# Patient Record
Sex: Male | Born: 1975
Health system: Southern US, Community
[De-identification: ages and names within clinical notes are randomized; demographics above are authoritative.]

## PROBLEM LIST (undated history)

## (undated) DIAGNOSIS — Z113 Encounter for screening for infections with a predominantly sexual mode of transmission: Secondary | ICD-10-CM

## (undated) DIAGNOSIS — I319 Disease of pericardium, unspecified: Secondary | ICD-10-CM

## (undated) DIAGNOSIS — N451 Epididymitis: Secondary | ICD-10-CM

## (undated) DIAGNOSIS — F419 Anxiety disorder, unspecified: Secondary | ICD-10-CM

## (undated) DIAGNOSIS — B690 Cysticercosis of central nervous system: Secondary | ICD-10-CM

## (undated) DIAGNOSIS — R197 Diarrhea, unspecified: Secondary | ICD-10-CM

## (undated) DIAGNOSIS — Z87438 Personal history of other diseases of male genital organs: Secondary | ICD-10-CM

## (undated) HISTORY — DX: Diarrhea, unspecified: R19.7

## (undated) HISTORY — DX: Epididymitis: N45.1

## (undated) HISTORY — DX: Anxiety disorder, unspecified: F41.9

## (undated) HISTORY — DX: Encounter for screening for infections with a predominantly sexual mode of transmission: Z11.3

## (undated) HISTORY — DX: Cysticercosis of central nervous system: B69.0

## (undated) HISTORY — DX: Disease of pericardium, unspecified: I31.9

## (undated) HISTORY — DX: Personal history of other diseases of male genital organs: Z87.438

---

## 2004-07-13 ENCOUNTER — Emergency Department (HOSPITAL_COMMUNITY): Admission: EM | Admit: 2004-07-13 | Discharge: 2004-07-13 | Payer: Self-pay | Admitting: Emergency Medicine

## 2007-07-07 ENCOUNTER — Emergency Department (HOSPITAL_COMMUNITY): Admission: EM | Admit: 2007-07-07 | Discharge: 2007-07-07 | Payer: Self-pay | Admitting: Family Medicine

## 2011-12-13 ENCOUNTER — Ambulatory Visit: Payer: Self-pay | Admitting: Family Medicine

## 2014-10-24 ENCOUNTER — Encounter (HOSPITAL_COMMUNITY): Payer: Self-pay

## 2014-10-24 ENCOUNTER — Emergency Department (INDEPENDENT_AMBULATORY_CARE_PROVIDER_SITE_OTHER)
Admission: EM | Admit: 2014-10-24 | Discharge: 2014-10-24 | Disposition: A | Discharge status: Home / Self Care | Payer: BLUE CROSS/BLUE SHIELD | Source: Home / Self Care | Attending: Emergency Medicine | Admitting: Emergency Medicine

## 2014-10-24 DIAGNOSIS — J111 Influenza due to unidentified influenza virus with other respiratory manifestations: Secondary | ICD-10-CM | POA: Diagnosis not present

## 2014-10-24 NOTE — ED Notes (Signed)
C/o sick w cough , fever , congestion, body aches, fatigue. Exposed to co-worker w flu

## 2014-10-24 NOTE — ED Provider Notes (Signed)
CSN: [id number]     Arrival date & time 10/24/14  1259 History   First MD Initiated Contact with Patient 10/24/14 1328     Chief Complaint  Patient presents with  . Cough  . Fever   (Consider location/radiation/quality/duration/timing/severity/associated sxs/prior Treatment) HPI He is a 39 year old man here for evaluation of fever and cough. His son is also here for the same symptoms. He states this started on Sunday with fever, body aches, chills, cough. He states the symptoms were bad Sunday through Tuesday. Since then he has gradually been improving. Today he complains of a mild lingering cough and some hoarseness of voice. No further fevers. No nausea or vomiting. He has been taking Mucinex with some improvement.  History reviewed. No pertinent past medical history. History reviewed. No pertinent past surgical history. History reviewed. No pertinent family history. History  Substance Use Topics  . Smoking status: Current Every Day Smoker  . Smokeless tobacco: Not on file  . Alcohol Use: Yes    Review of Systems  As in history of present illness  Allergies  Review of patient's allergies indicates no known allergies.  Home Medications   Prior to Admission medications   Not on File   BP 122/81 mmHg  Pulse 79  Temp(Src) 98.5 F (36.9 C) (Oral)  Resp 16  SpO2 95% Physical Exam  Constitutional: He is oriented to person, place, and time. He appears well-developed and well-nourished. No distress.  HENT:  Nose: Nose normal.  Mouth/Throat: Oropharynx is clear and moist. No oropharyngeal exudate.  Eyes: Conjunctivae are normal.  Neck: Neck supple.  Cardiovascular: Normal rate, regular rhythm and normal heart sounds.   No murmur heard. Pulmonary/Chest: Effort normal and breath sounds normal. No respiratory distress. He has no wheezes. He has no rales.  Lymphadenopathy:    He has no cervical adenopathy.  Neurological: He is alert and oriented to person, place, and time.     ED Course  Procedures (including critical care time) Labs Review Labs Reviewed - No data to display  Imaging Review No results found.   MDM   1. Influenza    He is recovering from the flu. He is well appearing. No concern for bacterial superinfection. Continue symptomatic care with Mucinex as needed.   Charm RingsErin J Royalti Schauf, MD 10/24/14 1420

## 2014-10-24 NOTE — Discharge Instructions (Signed)
You are recovering from the flu. You can continue to use over-the-counter Mucinex as needed. You should return to normal over the next few days.

## 2017-05-31 ENCOUNTER — Telehealth: Payer: Self-pay

## 2017-05-31 ENCOUNTER — Encounter: Payer: Self-pay | Admitting: Neurology

## 2017-05-31 ENCOUNTER — Encounter (INDEPENDENT_AMBULATORY_CARE_PROVIDER_SITE_OTHER): Payer: Self-pay

## 2017-05-31 ENCOUNTER — Ambulatory Visit: Payer: BLUE CROSS/BLUE SHIELD | Admitting: Neurology

## 2017-05-31 ENCOUNTER — Other Ambulatory Visit: Payer: Self-pay | Admitting: Neurology

## 2017-05-31 VITALS — BP 119/77 | HR 75 | Ht 70.0 in | Wt 170.4 lb

## 2017-05-31 DIAGNOSIS — G939 Disorder of brain, unspecified: Secondary | ICD-10-CM | POA: Insufficient documentation

## 2017-05-31 NOTE — Telephone Encounter (Signed)
Rn call [facility] at [phone] and [phone] [place] locations. Rn spoke with Alcario DroughtErica that no disc was sent with the referral. Alcario Droughtrica stated the pt was seen for the first time on 05/26/2017 at their office in the urgent care department. Pt does not have any office visits there bedside the urgent care visit. He has not seen any NP PA MD for regular check ups. Rn ask if the pt took any medications, or had any significant medical history. They stated no history is on pt bedside the left facial dropping. Rn stated pt is schedule for today a 230 to see neurologist for first time. Rn requested the disc be sent to [facility] Marvel [facility]indong [facility] as[facility]. Address given Alcario Droughtrica stated she will take care of it, and get disc sent out.

## 2017-05-31 NOTE — Patient Instructions (Addendum)
-   will do MRI with and without contrast  - will arrange for spinal tab - will do EEG  - will refer to neurosurgery for consultation - close observation and let us know if there any new issues during the interval time.  - will do blood culture today.  - follow up in one month

## 2017-05-31 NOTE — Progress Notes (Signed)
NEUROLOGY CLINIC NEW PATIENT NOTE  NAME: Cory Hernandez DOB: [birth year] REFERRING PHYSICIAN: Doreen Salvage, PA-C  I saw Cory Hernandez as a new consult in the neurovascular clinic today regarding  Chief Complaint  Patient presents with  . New Patient (Initial Visit)    Left side of face droop, has been having for a month went to [facility]  for issue and had a Ct scan at the place,  .  HPI: Cory Hernandez is a 41 y.o. male with no PMH who presents as a new patient for left facial droop.   Patient stated that for the last 3 weeks, he found himself slowing down his speech, intermittently stumbling on his words, feeling like talking with swollen tongue, also has drooling on the left side corner of the mouth.  3-4 days ago, he woke up with right facial twitching, intermittent, lasting all day long, eventually went away but her speech become more altered.  He looked at the mirror and found he had left facial droop.  He denies any headache, numbness, weakness, loss of consciousness, or fever.  Also no recent illness.  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  He was referred here for further evaluation.  He denied any significant past medical history.  Denies smoking or heavy drinking.  Denies any illicit drug use.  However he admitted that he used Viper, but regular content, no illegal stuff.   Past Medical History:  Diagnosis Date  . Pericarditis    History reviewed. No pertinent surgical history. Family History  Problem Relation Age of Onset  . Stroke Maternal Grandmother   . Stroke Paternal Grandfather    No current outpatient medications on file.   No current facility-administered medications for this visit.    No Known Allergies Social History   Socioeconomic History  . Marital status: Single    Spouse name: Not on file  . Number of children: Not on file  . Years of education: Not on file  .  Highest education level: Not on file  Social Needs  . Financial resource strain: Not on file  . Food insecurity - worry: Not on file  . Food insecurity - inability: Not on file  . Transportation needs - medical: Not on file  . Transportation needs - non-medical: Not on file  Occupational History  . Not on file  Tobacco Use  . Smoking status: Current Every Day Smoker    Packs/day: 0.25  . Smokeless tobacco: Never Used  . Tobacco comment: 1 to 2 per day  Substance and Sexual Activity  . Alcohol use: Yes    Alcohol/week: 0.6 oz    Types: 1 Cans of beer per week    Comment: socially  . Drug use: No  . Sexual activity: Not on file  Other Topics Concern  . Not on file  Social History Narrative  . Not on file    Review of Systems Full 14 system review of systems performed and notable only for those listed, all others are neg:  Constitutional:   Cardiovascular:  Ear/Nose/Throat:   Skin:  Eyes:   Respiratory:   Gastroitestinal:   Genitourinary:  Hematology/Lymphatic:   Endocrine:  Musculoskeletal:   Allergy/Immunology:   Neurological: Slurred speech Psychiatric:  Sleep:    Physical Exam  Vitals:   05/31/17 1501  BP: 119/77  Pulse: 75    General - Well nourished, well developed, in no apparent distress.  Ophthalmologic - Sharp disc margins OU.  Cardiovascular - Regular rate and rhythm with no murmur.   Neck - supple, no nuchal rigidity.  Mental Status -  Level of arousal and orientation to time, place, and person were intact. Language including expression, naming, repetition, comprehension, reading, and writing was assessed and found intact. Attention span and concentration were normal. Recent and remote memory were intact. Fund of Knowledge was assessed and was intact.  Cranial Nerves II - XII - II - Visual field intact OU. III, IV, VI - Extraocular movements intact. V - Facial sensation intact bilaterally. VII - left nasolabial fold flattening, and  mild left facial droop. VIII - Hearing & vestibular intact bilaterally. X - Palate elevates symmetrically, slight dysarthria. XI - Chin turning & shoulder shrug intact bilaterally. XII - Tongue protrusion intact.  Motor Strength - The patient's strength was normal in all extremities and pronator drift was absent.  Bulk was normal and fasciculations were absent.   Motor Tone - Muscle tone was assessed at the neck and appendages and was normal.  Reflexes - The patient's reflexes were normal in all extremities and he had no pathological reflexes.  Sensory - Light touch, temperature/pinprick, vibration and proprioception, and Romberg testing were assessed and were normal.    Coordination - The patient had normal movements in the hands and feet with no ataxia or dysmetria.  Tremor was absent.  Gait and Station - The patient's transfers, posture, gait, station, and turns were observed as normal.   Imaging  I have personally reviewed the radiological images below and agree with the radiology interpretations.  CT head 05/30/17  Right parietal hemisphere lesion for which diagnostic considerations include primary or metastatic tumor or possibly brain abscess.  Recommend neurosurgical evaluation.  Lab Review WBC 8.5, hemoglobin 14.6, platelet 270, creatinine 1.2, BUN 15, sodium 135  Assessment and Plan:   In summary, Cory Hernandez is a 41 y.o. male with no significant PMH presents with subacute onset left facial droop, dysarthria, and a one-time right facial twitching.  CT head showed right parietal lesion with border enhancement concerning for brain tumor vs abscess.  Patient denies fever, headache, or other neuro deficit. Will need further work up with MRI with and without contrast, EEG, LP, and NSG consult.   - will do MRI with and without contrast  - will arrange for spinal tab - will do EEG  - will refer to neurosurgery for consultation - close observation and let us know if there any  new issues during the interval time.  - will do blood culture today.  - follow up in one month  Thank you very much for the opportunity to participate in the care of this patient.  Please do not hesitate to call if any questions or concerns arise.  Orders Placed This Encounter  Procedures  . Culture, Blood  . Culture, Blood  . MR BRAIN W WO CONTRAST    Standing Status:   Future    Standing Expiration Date:   07/31/2018    Order Specific Question:   If indicated for the ordered procedure, I authorize the administration of contrast media per Radiology protocol    Answer:   Yes    Order Specific Question:   What is the patient's sedation requirement?    Answer:   No Sedation    Order Specific Question:   Does the patient have a pacemaker or implanted devices?    Answer:   No    Order  Specific Question:   Radiology Contrast Protocol - do NOT remove file path    Answer:   [email or web address]    Order Specific Question:   Preferred imaging location?    Answer:   [facility] (table limit-500 lbs)  . DG FLUORO GUIDED LOC OF NEEDLE/CATH TIP FOR SPINAL INJECT LT    Standing Status:   Future    Standing Expiration Date:   07/31/2018    Order Specific Question:   Lab orders requested (DO NOT place separate lab orders, these will be automatically ordered during procedure specimen collection):    Answer:   CSF cell count w differential    Comments:   crypto antigen, acid fast staining, TB culture    Order Specific Question:   Lab orders requested (DO NOT place separate lab orders, these will be automatically ordered during procedure specimen collection):    Answer:   CSF culture    Order Specific Question:   Lab orders requested (DO NOT place separate lab orders, these will be automatically ordered during procedure specimen collection):    Answer:   Gram stain    Order Specific Question:   Lab orders requested (DO NOT place separate lab orders, these will be  automatically ordered during procedure specimen collection):    Answer:   Protein and glucose, CSF    Order Specific Question:   Lab orders requested (DO NOT place separate lab orders, these will be automatically ordered during procedure specimen collection):    Answer:   VDRL, CSF    Order Specific Question:   Lab orders requested (DO NOT place separate lab orders, these will be automatically ordered during procedure specimen collection):    Answer:   Anaerobic culture    Order Specific Question:   Lab orders requested (DO NOT place separate lab orders, these will be automatically ordered during procedure specimen collection):    Answer:   Culture, fungus without smear    Order Specific Question:   Lab orders requested (DO NOT place separate lab orders, these will be automatically ordered during procedure specimen collection):    Answer:   Cytology - Non Pap    Order Specific Question:   Lab orders requested (DO NOT place separate lab orders, these will be automatically ordered during procedure specimen collection):    Answer:   Other    Order Specific Question:   Reason for Exam (SYMPTOM  OR DIAGNOSIS REQUIRED)    Answer:   brain lesion    Order Specific Question:   Preferred Imaging Location?    Answer:   [facility]    Order Specific Question:   Radiology Contrast Protocol - do NOT remove file path    Answer:   [email or web address]  . Ambulatory referral to Neurosurgery    Referral Priority:   Urgent    Referral Type:   Surgical    Referral Reason:   Specialty Services Required    Requested Specialty:   Neurosurgery    Number of Visits Requested:   1  . EEG adult    Standing Status:   Future    Standing Expiration Date:   05/31/2018    Order Specific Question:   Where should this test be performed?    Answer:   [facility]    No orders of the defined types were placed in this encounter.   Patient Instructions  - will do MRI with and  without contrast  - will arrange for spinal tab - will do EEG  -  will refer to neurosurgery for consultation - close observation and let us know if there any new issues during the interval time.  - will do blood culture today.  - follow up in one month   Marvel PlanJindong Anmarie Fukushima, MD PhD [facility] [address], [address] [place], [place] [id number] [phone]

## 2017-06-01 ENCOUNTER — Telehealth: Payer: Self-pay

## 2017-06-01 NOTE — Telephone Encounter (Signed)
If patient calls back ask him did he get the lab [facility] for blood [facility]. Also we need to schedule him for a monthly follow up with Dr. Roda ShuttersXu in January 2019 week of January 7, 8, 9. We can see him at 0400 or 1130. When he schedules I can put it in please write down what date and day, thanks.

## 2017-06-01 NOTE — Telephone Encounter (Signed)
LEft vm for patient to call back about blood cultures.[facility] did not have the tubes to collect the blood cultures.. [facility] [address] per our lab staff. PT labs are not showing done. Rn wanted to follow up with pt.

## 2017-06-05 ENCOUNTER — Telehealth: Payer: Self-pay | Admitting: Neurology

## 2017-06-05 NOTE — Telephone Encounter (Signed)
Call[facility] and [facility] to [facility] Jenel LucksRoberta [phone] spoke to Techs and patient needs to have MRI first.  Irving Burtonmily is working [facility]  Insurance is still processing Irving Burtonmily checked today 06/05/2017 and patient's   insurance is still pending MRI approval. Irving Burtonmily has to check back with insurance on Wednesday 06/07/2017. If patient is approved he will have his LP after .  Dr. Roda ShuttersXU and Katrina RN are aware of details. Dr. Roda ShuttersXU will get a follow up phone call on Wednesday 06/07/2017.

## 2017-06-06 LAB — CULTURE, BLOOD (SINGLE)

## 2017-06-07 NOTE — Telephone Encounter (Signed)
Let's keep finger crossed. Thanks.   Marvel PlanJindong Jarett Dralle, MD PhD Stroke Neurology 06/07/2017 4:53 PM

## 2017-06-07 NOTE — Telephone Encounter (Signed)
Patient did get lab work for blood cultures.

## 2017-06-07 NOTE — Telephone Encounter (Signed)
I spoke to the patients insurance this afternoon. They informed me that it has been clinical approved but it is pending to see if [facility] is in their net work they stated that it could take up to 24 hours to see if they are in [facility] network. They did inform me that they are going to expedite it. So hopefully I will no an answer tomorrow.

## 2017-06-08 NOTE — Telephone Encounter (Signed)
I just got off the phone with the patients insurance [employer] [place]. They informed me that [facility] is not in network with them and they wouldn't release the authorization number to me since it's not in network with [facility]. I then asked her if we could try another facility she stated that we would have to start a whole new case and that could be another 72 hours to see if that is in network because they would have to redo the authorization to another sight selection. Even after 72 hours it is not guaranteed that the patient will get a approval. It could possibly be another week before insurance makes a decision.

## 2017-06-08 NOTE — Telephone Encounter (Signed)
Discussed with pt over the phone. I told him that his [place] [employer] did not approve tests to be done in [place] as it was not in the network. We can submit another facility around this area to try but no guarantee, and that may take up to a week also. Another options is to go to ER for expedited work up but may need out of pocket payment later.   He said so far he is doing well but has been overwhelmed about the insurance issue. He is not interested now going to ER and later pay out of pocket to be "bankrupt". He is working with his employer to see if he qualifies [employer] insurance here. He has EEG scheduled next Monday and will let us know next Monday.   His another options, as he is aware, is to go back to [place] address where his girlfriend still lives there, and do all the work up and treatment there under his current insurance, and he can come back for continued follow up with us after that. He is considering this option too now. However, he will let us know next Monday when he comes for the EEG appointment.   Hi, Irving BurtonEmily, at the meantime, can we continue to work with [place] [employer] to see if he can get any approval for doing work up here? Thanks.   Marvel PlanJindong Bentleigh Stankus, MD PhD Stroke Neurology 06/08/2017 3:43 PM

## 2017-06-09 NOTE — Telephone Encounter (Signed)
I started a new case with [facility] as the sight location. They require for clinical notes to be faxed again. I faxed clinical notes and it's pending to see if it is approved for [facility].

## 2017-06-09 NOTE — Telephone Encounter (Signed)
This morning I am going to call his insurance and start a new case for [facility] to see if we are in network.

## 2017-06-12 ENCOUNTER — Other Ambulatory Visit: Payer: BLUE CROSS/BLUE SHIELD

## 2017-06-14 NOTE — Telephone Encounter (Signed)
I called his insurance to check status. It was still pending I spoke to one of their nurse's and was able to get it approved but they would not release the authorization number to me because the sight selection needed to be verified to see if it is in net work. I did try for the sight selection to be [facility].

## 2017-06-14 NOTE — Telephone Encounter (Signed)
Thanks, Irving Burtonmily, for your strong work.   Marvel PlanJindong Dalene Robards, MD PhD Stroke Neurology 06/14/2017 4:43 PM

## 2017-06-15 ENCOUNTER — Ambulatory Visit: Payer: Self-pay | Admitting: Neurology

## 2017-06-15 DIAGNOSIS — R2990 Unspecified symptoms and signs involving the nervous system: Secondary | ICD-10-CM

## 2017-06-15 DIAGNOSIS — G939 Disorder of brain, unspecified: Secondary | ICD-10-CM

## 2017-06-15 NOTE — Procedures (Signed)
    History:  Cory BjornstadRoberto Blancett is a [age] year old gentleman with a history of slurring of speech, he has had some right facial twitching and further alteration in speech.  The patient is being evaluated for this issue.  CT of the brain showed a right parietal lesion.  This is a routine EEG.  No skull defects are noted.  No medications are listed.  EEG classification: Normal awake  Description of the recording: The background rhythms of this recording consists of a fairly well modulated medium amplitude alpha rhythm of 11 Hz that is reactive to eye opening and closure. As the record progresses, the patient appears to remain in the waking state throughout the recording. Photic stimulation was performed, resulting in a bilateral and symmetric photic driving response. Hyperventilation was also performed, resulting in a minimal buildup of the background rhythm activities without significant slowing seen. At no time during the recording does there appear to be evidence of spike or spike wave discharges or evidence of focal slowing. EKG monitor shows no evidence of cardiac rhythm abnormalities with a heart rate of 60.  Impression: This is a normal EEG recording in the waking state. No evidence of ictal or interictal discharges are seen.

## 2017-06-19 ENCOUNTER — Telehealth: Payer: Self-pay | Admitting: *Deleted

## 2017-06-19 NOTE — Telephone Encounter (Signed)
Notes recorded by Hildred AlaminMurrell, Katrina Y, RN on 06/19/2017 at 12:00 PM EST Rn call patient that the EEG was normal. Pt verbalized understanding. ------

## 2017-06-19 NOTE — Telephone Encounter (Signed)
Rn call patient to give him the EEG results.Pt stated he will be getting insurance with his employer in [place]. Pt stated it was effective about a week or two ago. Rn stated if he can bring in or fax the insurance information Irving Burtonmily can try to authorized on his new insurance. Pt will contact us once he finds out the insurance ID number.

## 2017-06-19 NOTE — Telephone Encounter (Signed)
Just got off the phone with the patients insurance and they informed me that [facility] is not in network for the exam. I guess right now I will wait for his new insurance and do the authorization on his new insurance.

## 2017-06-19 NOTE — Telephone Encounter (Signed)
RN receive a message that pt wanted EEG results.

## 2017-06-20 NOTE — Telephone Encounter (Signed)
I agree. Thanks much.   Marvel PlanJindong Brinda Focht, MD PhD Stroke Neurology 06/20/2017 5:17 PM

## 2017-06-21 NOTE — Telephone Encounter (Signed)
Called Neurosurgery  [phone] . Neurosurgery left three voice mails for patient. Patient has not called back. I called patient and left him a message asking him to call Neurosurgery and to call me back at Dr. Warren DanesXu's office.

## 2017-06-21 NOTE — Telephone Encounter (Signed)
Thank you, Irving Burtonmily.   Katrina, how about the neurosurgery consultation? Do we have a date yet? Thanks.   Marvel PlanJindong Latonda Larrivee, MD PhD Stroke Neurology 06/21/2017 2:37 PM

## 2017-06-21 NOTE — Telephone Encounter (Signed)
Patient called me and gave me his new insurance information. I did get the authorization with his new insurance [employer]. And I called [facility] and the first available that they had for a MRI brain w/wo contrast was Wednesday 06/28/17. He is scheduled for that day. I did leave a detail message about this on the patients phone.

## 2017-06-21 NOTE — Telephone Encounter (Signed)
Your welcome.

## 2017-06-28 ENCOUNTER — Ambulatory Visit
Admission: RE | Admit: 2017-06-28 | Discharge: 2017-06-28 | Disposition: A | Discharge status: Home / Self Care | Payer: 59 | Source: Ambulatory Visit | Attending: Neurology | Admitting: Neurology

## 2017-06-28 DIAGNOSIS — G939 Disorder of brain, unspecified: Secondary | ICD-10-CM | POA: Diagnosis not present

## 2017-06-28 MED ORDER — GADOBENATE DIMEGLUMINE 529 MG/ML IV SOLN
15.0000 mL | Freq: Once | INTRAVENOUS | Status: AC | PRN
  Administered 2017-06-28: 15 mL via INTRAVENOUS

## 2017-07-05 ENCOUNTER — Telehealth: Payer: Self-pay | Admitting: Neurology

## 2017-07-05 NOTE — Telephone Encounter (Signed)
Pt's mother is requesting MRI results. She is aware it could be the end of this week or next week before she gets a return call.

## 2017-07-05 NOTE — Telephone Encounter (Signed)
Pts is requesting MRI results and is aware that Dr Roda ShuttersXu is at hospital this week

## 2017-07-05 NOTE — Telephone Encounter (Signed)
Called pt and able to talk to him over the phone. Delivered MRI result to him and asked him to call neurosurgery immediately to schedule appointment. Will hold off LP at this time and pending neurosurgery opinion. He said he will call neurosurgery immediately to schedule appointment.   Hi, Annabelle HarmanDana, could you please hold off the LP at this time and make sure neurosurgery can see him ASAP? Thanks a lot.   Marvel PlanJindong Raheen Capili, MD PhD Stroke Neurology 07/05/2017 4:07 PM

## 2017-07-06 NOTE — Telephone Encounter (Signed)
Strong work! Thank you so much, Annabelle Harmanana.   Marvel PlanJindong Jaiya Mooradian, MD PhD Stroke Neurology 07/06/2017 10:36 PM

## 2017-07-06 NOTE — Telephone Encounter (Signed)
I have called and left patient x3   messages to call neurosurgery and to call me back  . Patient has not called me back or Neurosurgery.

## 2017-07-06 NOTE — Telephone Encounter (Signed)
Please call patients mom on dpr about neurosurgery referral. IF messages have been left for patient multiple times.

## 2017-07-06 NOTE — Telephone Encounter (Signed)
Spoke to Patient's Mother and she was very upset patient went to [place] . Patient's mother relayed he will be back tonight and she will talk to him. I will call patient's mother back  And give her telephone number to Neurosurgery .  Patient's mother relayed that her son relayed he does not want surgery. I relayed to Patient mother that this is  something that patient needs to do  .  I relayed that Dr. Roda ShuttersXu gave Patient results on 07/05/2017 . Patient has to call [facility] to schedule apt . Katrina RN was with me when I was talking to patient's mother .

## 2017-07-06 NOTE — Telephone Encounter (Signed)
All Records have been resent to [facility] including MRI. I have talked to patient and he is going to call Neuro surgery today and schedule..Marland Kitchen

## 2017-07-06 NOTE — Telephone Encounter (Signed)
Pt has called in response to speak with Dr Roda ShuttersXu on yesterday.  Pt tried calling Neurosurgery and was told they they need his records.  [place] [facility] [facility] and is asking that the appointment be set for him with Neurosurgery without a lot of leg work on his end.  He is asking that all needed paperwork be sent over to them and a date be set as soon as possible for him to be seen based upon the urgency he understood from Dr Roda ShuttersXu.

## 2017-07-06 NOTE — Telephone Encounter (Signed)
Patient is scheduled For Neurosurgery referral Jan 8 th at 11:00 am with Dr. Wynetta Emeryram . Patient is aware and his mother.

## 2017-07-06 NOTE — Telephone Encounter (Signed)
[name]  [name],Cory Hernandez [phone]    Left message asking her to call me back.

## 2017-07-17 ENCOUNTER — Ambulatory Visit: Payer: 59 | Admitting: Infectious Disease

## 2017-07-24 ENCOUNTER — Encounter: Payer: Self-pay | Admitting: Infectious Disease

## 2017-07-24 ENCOUNTER — Ambulatory Visit (INDEPENDENT_AMBULATORY_CARE_PROVIDER_SITE_OTHER): Payer: 59 | Admitting: Infectious Disease

## 2017-07-24 VITALS — BP 122/80 | HR 67 | Temp 97.6°F | Ht 70.0 in | Wt 168.0 lb

## 2017-07-24 DIAGNOSIS — A630 Anogenital (venereal) warts: Secondary | ICD-10-CM

## 2017-07-24 DIAGNOSIS — B690 Cysticercosis of central nervous system: Secondary | ICD-10-CM | POA: Insufficient documentation

## 2017-07-24 DIAGNOSIS — A09 Infectious gastroenteritis and colitis, unspecified: Secondary | ICD-10-CM | POA: Diagnosis not present

## 2017-07-24 DIAGNOSIS — R197 Diarrhea, unspecified: Secondary | ICD-10-CM

## 2017-07-24 DIAGNOSIS — G939 Disorder of brain, unspecified: Secondary | ICD-10-CM

## 2017-07-24 HISTORY — DX: Diarrhea, unspecified: R19.7

## 2017-07-24 HISTORY — DX: Cysticercosis of central nervous system: B69.0
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
## 2017-07-25 LAB — COMPLETE METABOLIC PANEL WITH GFR
AG Ratio: 1.7 (calc) (ref 1.0–2.5)
ALT: 15 U/L (ref 9–46)
AST: 16 U/L (ref 10–40)
Albumin: 4.5 g/dL (ref 3.6–5.1)
Alkaline phosphatase (APISO): 66 U/L (ref 40–115)
BUN: 11 mg/dL (ref 7–25)
CALCIUM: 9.6 mg/dL (ref 8.6–10.3)
CO2: 24 mmol/L (ref 20–32)
CREATININE: 0.92 mg/dL (ref 0.60–1.35)
Chloride: 106 mmol/L (ref 98–110)
GFR, EST AFRICAN AMERICAN: 119 mL/min/{1.73_m2} (ref >=60)
GFR, EST NON AFRICAN AMERICAN: 103 mL/min/{1.73_m2} (ref >=60)
GLUCOSE: 95 mg/dL (ref 65–99)
Globulin: 2.6 g/dL (calc) (ref 1.9–3.7)
Potassium: 4.1 mmol/L (ref 3.5–5.3)
Sodium: 139 mmol/L (ref 135–146)
TOTAL PROTEIN: 7.1 g/dL (ref 6.1–8.1)
Total Bilirubin: 0.6 mg/dL (ref 0.2–1.2)

## 2017-07-25 LAB — HEPATITIS C ANTIBODY
HEP C AB: NONREACTIVE
SIGNAL TO CUT-OFF: 0.01 (ref <1.00)

## 2017-07-25 LAB — CBC WITH DIFFERENTIAL/PLATELET
BASOS ABS: 59 {cells}/uL (ref 0–200)
BASOS PCT: 0.9 %
EOS PCT: 4.1 %
Eosinophils Absolute: 271 cells/uL (ref 15–500)
HEMATOCRIT: 41.2 % (ref 38.5–50.0)
HEMOGLOBIN: 14.4 g/dL (ref 13.2–17.1)
Lymphs Abs: 1610 cells/uL (ref 850–3900)
MCH: 29.7 pg (ref 27.0–33.0)
MCHC: 35 g/dL (ref 32.0–36.0)
MCV: 84.9 fL (ref 80.0–100.0)
MPV: 10.4 fL (ref 7.5–12.5)
Monocytes Relative: 7.1 %
NEUTROS ABS: 4191 {cells}/uL (ref 1500–7800)
Neutrophils Relative %: 63.5 %
Platelets: 270 10*3/uL (ref 140–400)
RBC: 4.85 10*6/uL (ref 4.20–5.80)
RDW: 12.3 % (ref 11.0–15.0)
Total Lymphocyte: 24.4 %
WBC mixed population: 469 cells/uL (ref 200–950)
WBC: 6.6 10*3/uL (ref 3.8–10.8)

## 2017-07-25 LAB — HEPATITIS B SURFACE ANTIGEN: HEP B S AG: NONREACTIVE

## 2017-07-25 LAB — HIV ANTIBODY (ROUTINE TESTING W REFLEX): HIV 1&2 Ab, 4th Generation: NONREACTIVE

## 2017-07-26 LAB — QUANTIFERON-TB GOLD PLUS
MITOGEN-NIL: 6.7 [IU]/mL
NIL: 0.02 IU/mL
QuantiFERON-TB Gold Plus: NEGATIVE
TB1-NIL: 0.03 IU/mL
TB2-NIL: 0.03 [IU]/mL

## 2017-07-27 ENCOUNTER — Telehealth: Payer: Self-pay | Admitting: Neurology

## 2017-07-27 ENCOUNTER — Telehealth: Payer: Self-pay | Admitting: *Deleted

## 2017-07-27 NOTE — Telephone Encounter (Addendum)
Dr. Wynetta Emeryram office notes on Dr.Xu desk for review.

## 2017-07-27 NOTE — Telephone Encounter (Signed)
Pt's mother called said the pt is needing a letter sent to his work releasing him to work his regular job. She said Dr Wynetta Emeryram has released him but his job is requesting release from neurology also. Please call to advise

## 2017-07-27 NOTE — Telephone Encounter (Signed)
Please request neurosurgery Dr. Lonie Peakram's office note first. Can not make decision before reviewing Dr. Lonie Peakram's note. Thanks.  Marvel PlanJindong Trinia Georgi, MD PhD Stroke Neurology 07/27/2017 3:56 PM

## 2017-07-27 NOTE — Telephone Encounter (Signed)
ERROR

## 2017-07-27 NOTE — Telephone Encounter (Signed)
Message sent to Dr.Xu. 

## 2017-07-31 ENCOUNTER — Other Ambulatory Visit: Payer: Self-pay | Admitting: Pharmacist

## 2017-07-31 ENCOUNTER — Telehealth: Payer: Self-pay | Admitting: Infectious Disease

## 2017-07-31 MED ORDER — ALBENDAZOLE 200 MG PO TABS: 600.0000 mg | ORAL_TABLET | Freq: Two times a day (BID) | ORAL | 0 refills | Status: DC

## 2017-07-31 MED ORDER — DEXAMETHASONE 2 MG PO TABS: 2.0000 mg | ORAL_TABLET | Freq: Two times a day (BID) | ORAL | 0 refills | Status: DC

## 2017-07-31 MED ORDER — DEXAMETHASONE 4 MG PO TABS: 4.0000 mg | ORAL_TABLET | Freq: Two times a day (BID) | ORAL | 0 refills | Status: AC

## 2017-07-31 MED ORDER — DEXAMETHASONE 1 MG PO TABS: 1.0000 mg | ORAL_TABLET | ORAL | 0 refills | Status: DC

## 2017-07-31 NOTE — Telephone Encounter (Signed)
I called pt to propose starting empiric rx for Neurocysticercosis with   Decadron 4mg  po bid 24 hours before starting albendazole and then x 15 days and taper Albendazole THREE 200 mg tablets BID x 15 days  Decadron taper:  Decadron 2mg  po bid x 7 days "" 1 mg bid x 7 days 1mg  daily x 7 days

## 2017-07-31 NOTE — Telephone Encounter (Signed)
revised 

## 2017-07-31 NOTE — Telephone Encounter (Signed)
I saw Dr. Wynetta Emeryram note. I called pt to ask more about his job description and whether Dr. Wynetta Emeryram has put any job limitation for him back to work. Nobody picked up the phone. I left VM for him to call back. If he calls back, please ask about what type of job he did in the past and whether Dr. Wynetta Emeryram has any limitation for his work. Also, ask him about the best time and best number for us to call him back. Thanks.   Marvel PlanJindong Brithany Whitworth, MD PhD Stroke Neurology 07/31/2017 4:36 PM

## 2017-08-02 NOTE — Telephone Encounter (Signed)
Called pt again at 6:40pm. He did not picked up the call. I have left VM again. If he calls back, please ask him when will be the best time for me to call him. Thanks.   Marvel PlanJindong Yeva Bissette, MD PhD Stroke Neurology 08/02/2017 6:43 PM

## 2017-08-02 NOTE — Telephone Encounter (Signed)
Pt is asking for a call from RN Katrina in response to pt's last call with Dr Roda ShuttersXu

## 2017-08-03 NOTE — Telephone Encounter (Signed)
IF patient calls back please give him Dr. Roda ShuttersXu two phone notes.Dr Roda ShuttersXu call pt on 07/31/2017, and 08/02/2017. He left two vm. Dr Roda ShuttersXu wants to know a good number, and time to call pt back. Also tell pt Dr. Roda ShuttersXu will not be in the office next week. He is off and will be in a conference.

## 2017-08-04 ENCOUNTER — Encounter: Payer: Self-pay | Admitting: Neurology

## 2017-08-04 ENCOUNTER — Telehealth: Payer: Self-pay | Admitting: Neurology

## 2017-08-04 ENCOUNTER — Telehealth: Payer: Self-pay | Admitting: *Deleted

## 2017-08-04 NOTE — Telephone Encounter (Signed)
Patient called and is very worried and upset. He would like a call back about a letter he recently received from Dr Daiva EvesVan Dam and some of the things he referenced in the letter. He advised he has spoken to [employer] Data[employer]le RN and she went over the letter with him and tried to put him at ease but he still has questions. He advised he can not wait until 08/29/17 to see the doctor and would like an earlier appt or a phone call from the doctor. He also said he does not want to start treatment with out  Visit with the doctor. Advised will send the doctor a message and let him know his concerns and cc michelle as well.

## 2017-08-04 NOTE — Telephone Encounter (Signed)
Discussed with pt over the phone and he has been working even since his diagnosis. He was put on some restriction back then. He saw Dr. Wynetta Emeryram and he released him to full job duty without restriction. Currently, he is working without restriction. He would like to have another clearance letter from us.   He has been symptom free since he saw me last visit. His EEG negative for seizure and he has had no seizures in the past. I feel comfortable to clear him for work at this time. Letter will be provided. He knows to contact us if he develops any symptoms.   Marvel PlanJindong Ricka Westra, MD PhD Stroke Neurology 08/04/2017 4:11 PM

## 2017-08-04 NOTE — Telephone Encounter (Signed)
Patient was concerned about the mention of genital warts in his health history when he read the office note from his visit with Dr Daiva EvesVan Dam. RN explained to him that this came from a previous diagnosis at a visit with another provider, explained what HPV is and how common it is. Patient verbalized understanding, appreciation for the information, was no longer anxious about what that diagnosis meant or the mention of it in his office note.

## 2017-08-04 NOTE — Telephone Encounter (Signed)
Pt is calling back stating that he works Monday- Friday 9:30-6:30 and is still wanting a call and is very sorry for the inconvenience. Please contact  At [phone](pt turned his volume all the way up and has let staff know that if he gets a call he will have to drop what he is doing to answer) or if Dr. Roda ShuttersXu can just send the note so that he can continue his regular work activities

## 2017-08-04 NOTE — Telephone Encounter (Signed)
Error

## 2017-08-07 NOTE — Telephone Encounter (Signed)
Does Regan RakersRoberto want to start treatment for presumed neurocysticercosis?  I put the prescriptios in the computer but did not send them because I wanted to make surehe was comfortable starting  If he is nervous about the drugs at all he can come in and speak to Bethle[place]ie or CapronMinh. Cassie helped me with the doses

## 2017-08-07 NOTE — Telephone Encounter (Signed)
Ill look at note re the letter. Yeah I just pulled that into his history from care everywhere

## 2017-08-07 NOTE — Telephone Encounter (Signed)
Called patient to offer a visit with pharmacy and had to leave a message for him to call the office and schedule to see Cassie to answer questions.

## 2017-08-07 NOTE — Telephone Encounter (Signed)
Letter fax to patients employer Attention: [employer]Chris O[employer]Con[employer] at [phone].Letter receive and confirmed.

## 2017-08-07 NOTE — Telephone Encounter (Signed)
He does not want to start without speaking with someone. I will schedule him to see Cassie.

## 2017-08-15 ENCOUNTER — Telehealth: Payer: Self-pay | Admitting: Infectious Disease

## 2017-08-15 NOTE — Telephone Encounter (Signed)
Ok excellent. Well it might be back by time I see him. He doesn't want to start rx until then

## 2017-08-15 NOTE — Telephone Encounter (Signed)
Spoke with Clydie BraunKaren. CDC turn around time is 3-4 weeks.

## 2017-08-15 NOTE — Telephone Encounter (Signed)
Can someone ask Clydie BraunKaren if we have any turn around on his cysticerc[facility]?  I spetn about 20 minutes with Mom today re him.   For now he will keep appt with me, dont schedule anythihng with ID pharmacy yet

## 2017-08-24 ENCOUNTER — Ambulatory Visit: Payer: 59 | Admitting: Infectious Disease

## 2017-08-29 ENCOUNTER — Ambulatory Visit (INDEPENDENT_AMBULATORY_CARE_PROVIDER_SITE_OTHER): Payer: 59 | Admitting: Infectious Disease

## 2017-08-29 ENCOUNTER — Encounter: Payer: Self-pay | Admitting: Infectious Disease

## 2017-08-29 VITALS — BP 102/72 | HR 92 | Temp 98.1°F | Ht 70.0 in | Wt 162.0 lb

## 2017-08-29 DIAGNOSIS — B690 Cysticercosis of central nervous system: Secondary | ICD-10-CM | POA: Diagnosis not present

## 2017-08-29 DIAGNOSIS — Z87438 Personal history of other diseases of male genital organs: Secondary | ICD-10-CM | POA: Diagnosis not present

## 2017-08-29 DIAGNOSIS — Z113 Encounter for screening for infections with a predominantly sexual mode of transmission: Secondary | ICD-10-CM | POA: Diagnosis not present

## 2017-08-29 DIAGNOSIS — F419 Anxiety disorder, unspecified: Secondary | ICD-10-CM

## 2017-08-29 DIAGNOSIS — N451 Epididymitis: Secondary | ICD-10-CM

## 2017-08-29 DIAGNOSIS — G939 Disorder of brain, unspecified: Secondary | ICD-10-CM

## 2017-08-29 DIAGNOSIS — I319 Disease of pericardium, unspecified: Secondary | ICD-10-CM | POA: Insufficient documentation

## 2017-08-29 DIAGNOSIS — A09 Infectious gastroenteritis and colitis, unspecified: Secondary | ICD-10-CM | POA: Diagnosis not present

## 2017-08-29 HISTORY — DX: Personal history of other diseases of male genital organs: Z87.438

## 2017-08-29 HISTORY — DX: Epididymitis: N45.1

## 2017-08-29 HISTORY — DX: Disease of pericardium, unspecified: I31.9

## 2017-08-29 HISTORY — DX: Anxiety disorder, unspecified: F41.9

## 2017-08-29 HISTORY — DX: Encounter for screening for infections with a predominantly sexual mode of transmission: Z11.3

## 2017-08-29 MED ORDER — DEXAMETHASONE 1 MG PO TABS: 1.0000 mg | ORAL_TABLET | ORAL | 0 refills | Status: DC

## 2017-08-29 MED ORDER — ALBENDAZOLE 200 MG PO TABS: 600.0000 mg | ORAL_TABLET | Freq: Two times a day (BID) | ORAL | 0 refills | Status: DC

## 2017-08-29 MED ORDER — DEXAMETHASONE 4 MG PO TABS: 4.0000 mg | ORAL_TABLET | Freq: Two times a day (BID) | ORAL | 0 refills | Status: DC

## 2017-08-29 MED ORDER — DEXAMETHASONE 2 MG PO TABS: 2.0000 mg | ORAL_TABLET | Freq: Two times a day (BID) | ORAL | 0 refills | Status: DC
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
## 2017-08-30 LAB — RPR: RPR: NONREACTIVE

## 2017-08-31 ENCOUNTER — Telehealth: Payer: Self-pay | Admitting: *Deleted

## 2017-09-01 ENCOUNTER — Encounter: Payer: Self-pay | Admitting: Pharmacist Clinician (PhC)/ Clinical Pharmacy Specialist

## 2017-09-01 ENCOUNTER — Other Ambulatory Visit: Payer: Self-pay | Admitting: Pharmacist Clinician (PhC)/ Clinical Pharmacy Specialist

## 2017-09-01 MED ORDER — ALBENDAZOLE 200 MG PO TABS: 600.0000 mg | ORAL_TABLET | Freq: Two times a day (BID) | ORAL | 0 refills | Status: DC

## 2017-09-01 MED ORDER — DEXAMETHASONE 1 MG PO TABS: 1.0000 mg | ORAL_TABLET | ORAL | 0 refills | Status: DC

## 2017-09-01 MED ORDER — DEXAMETHASONE 2 MG PO TABS: 2.0000 mg | ORAL_TABLET | Freq: Two times a day (BID) | ORAL | 0 refills | Status: DC

## 2017-09-01 MED ORDER — PRAZIQUANTEL 600 MG PO TABS: ORAL_TABLET | ORAL | 1 refills | Status: DC

## 2017-09-01 MED ORDER — DEXAMETHASONE 4 MG PO TABS: 4.0000 mg | ORAL_TABLET | Freq: Two times a day (BID) | ORAL | 0 refills | Status: DC

## 2017-09-01 NOTE — Progress Notes (Signed)
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 Discuss the case with Dr. Drue SecondSnider today and she agreed. We will us a loading dose of 100mg /kg TID on day one then 50mg /kg TID on day 2-15. Rx has been [facility] to [facility]. He is out of town right now and should be back by early next week.

## 2017-09-01 NOTE — Progress Notes (Signed)
Tx to [facility] to see about cost.

## 2017-09-01 NOTE — Telephone Encounter (Signed)
Patient called for results of std testing. Notified that gonorrhea and chlamydia test were not performed. He will come back for another urine test. He is concerned about his dose/cost of albendazole.  Forwarded to Dr Zenaida NieceVan Dam/Minh for follow up. Patient will be back in town next week. Andree CossHowell, Michelle M, RN

## 2017-09-04 ENCOUNTER — Other Ambulatory Visit: Payer: Self-pay | Admitting: Pharmacist Clinician (PhC)/ Clinical Pharmacy Specialist

## 2017-09-04 MED ORDER — DEXAMETHASONE 4 MG PO TABS: 4.0000 mg | ORAL_TABLET | Freq: Two times a day (BID) | ORAL | 0 refills | Status: DC

## 2017-09-04 MED ORDER — DEXAMETHASONE 2 MG PO TABS: 2.0000 mg | ORAL_TABLET | Freq: Two times a day (BID) | ORAL | 0 refills | Status: AC

## 2017-09-04 MED ORDER — PRAZIQUANTEL 600 MG PO TABS: ORAL_TABLET | ORAL | 1 refills | Status: DC

## 2017-09-04 MED ORDER — DEXAMETHASONE 1 MG PO TABS: 1.0000 mg | ORAL_TABLET | ORAL | 0 refills | Status: DC

## 2017-09-07 ENCOUNTER — Telehealth: Payer: Self-pay | Admitting: Pharmacist Clinician (PhC)/ Clinical Pharmacy Specialist

## 2017-09-07 NOTE — Telephone Encounter (Signed)
Called Regan RakersRoberto to see if he has an update on his COBRA coverage. He is back in town right now. He said that the company has sent him the paper work to sign but it has to be done by snail mail "by law". Unfortunately, the cost for albendazole or praziquantel is ridiculous ($9000-22000). He said that he will call us back immediately once his cobra is active.

## 2017-09-07 NOTE — Telephone Encounter (Signed)
Just insane

## 2017-09-15 ENCOUNTER — Telehealth: Payer: Self-pay | Admitting: Pharmacist Clinician (PhC)/ Clinical Pharmacy Specialist

## 2017-09-15 NOTE — Telephone Encounter (Signed)
Excellent

## 2017-09-15 NOTE — Telephone Encounter (Signed)
Reached out to [name] today to see if he has COBRA yet. He said that it should be active today or Monday. He'll call us back and let us know so we can get him the albendazole.

## 2017-09-18 MED FILL — DEXAMETHASONE 2 MG TABLET: 2 | 31 days supply | Qty: 82 | Fill #0

## 2017-09-19 ENCOUNTER — Telehealth: Payer: Self-pay | Admitting: Pharmacist Clinician (PhC)/ Clinical Pharmacy Specialist

## 2017-09-19 MED FILL — PRAZIQUANTEL 600 MG TABS: 600 | 15 days supply | Qty: 96 | Fill #0

## 2017-09-19 NOTE — Telephone Encounter (Signed)
Excellent

## 2017-09-19 NOTE — Telephone Encounter (Signed)
His COBRA is now active. Cory CanalesSteve said that the copay for the praziquantel is $50 and dexamethasone is $15. :Left him a VM to call back to tell him about it. Albendazole requires a PA, therefore, we are going with praziquantel.

## 2017-09-19 NOTE — Telephone Encounter (Signed)
Rob called back about his Praziquantel and dexamethasone. He will call the pharmacy now pick up.

## 2017-09-27 ENCOUNTER — Ambulatory Visit: Payer: 59 | Admitting: Infectious Disease

## 2017-10-27 MED FILL — DEXAMETHASONE 2 MG TABLET: 2 | 1 days supply | Qty: 3 | Fill #1

## 2017-11-01 ENCOUNTER — Telehealth: Payer: Self-pay | Admitting: *Deleted

## 2017-11-01 NOTE — Telephone Encounter (Signed)
Patient called and advised he started the medications Decadron and Biltricide on 09/28/17 and his last dose was 10/28/17. He advised the medications were horrible but since stopping he feels better. He now wants to know his next step is and when he is to see Dr Daiva Eves again. Advised him will have to send the provider a note and once her responds someone will give him a call back.

## 2017-11-01 NOTE — Telephone Encounter (Signed)
We need to get a repeat MRI on schedule. Lets set one up by end of May

## 2017-11-02 ENCOUNTER — Other Ambulatory Visit: Payer: Self-pay | Admitting: Infectious Disease

## 2017-11-02 DIAGNOSIS — B699 Cysticercosis, unspecified: Secondary | ICD-10-CM

## 2017-11-02 NOTE — Telephone Encounter (Signed)
Sorry. I have put order in now

## 2017-11-02 NOTE — Progress Notes (Signed)
Order for BRAIN mri in

## 2017-11-02 NOTE — Telephone Encounter (Addendum)
If you place the order I will get Morrie Sheldon started on scheduling.

## 2017-11-07 NOTE — Telephone Encounter (Signed)
Information given to Ashely to schedule appt by end of May.

## 2017-11-29 ENCOUNTER — Ambulatory Visit
Admission: RE | Admit: 2017-11-29 | Discharge: 2017-11-29 | Disposition: A | Discharge status: Home / Self Care | Payer: 59 | Source: Ambulatory Visit | Attending: Infectious Disease | Admitting: Infectious Disease

## 2017-11-29 DIAGNOSIS — B699 Cysticercosis, unspecified: Secondary | ICD-10-CM

## 2017-11-29 MED ORDER — GADOBENATE DIMEGLUMINE 529 MG/ML IV SOLN
15.0000 mL | Freq: Once | INTRAVENOUS | Status: AC | PRN
  Administered 2017-11-29: 15 mL via INTRAVENOUS

## 2017-12-06 ENCOUNTER — Telehealth: Payer: Self-pay | Admitting: Behavioral Health

## 2017-12-06 NOTE — Telephone Encounter (Signed)
-----   Message from Randall Hissornelius N Van Dam, MD sent at 11/29/2017 12:54 PM EDT ----- MRI brain looks better. 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

## 2017-12-06 NOTE — Telephone Encounter (Addendum)
Called patient spoke with he and his mother.  Let them know per Dr. Daiva EvesVan Dam that his MRI of the brain looks better.  Explained T2 intense mass 26mm to 16mm post treatment for Neurocysticercosis.  Let them know Neurocysticercosis  was the correct diagnosis and he will not need brain surgery.  They verbalized understanding but was unsure who to follow up with.   Angeline SlimAshley Calistro Rauf RN

## 2017-12-07 NOTE — Telephone Encounter (Signed)
He can followup with me if he wants. I think the main person he should followup with his a Neurologist because they can do better Neuro exams. He doesn't need my help unless he gets a 2nd problem.I am happy to see him but thought he should spend his money better with Neuro

## 2017-12-07 NOTE — Telephone Encounter (Signed)
Called Cory Hernandez  Had to leave a message which he said was ok to do yesterday.  Let him know he can follow up with Dr. Daiva EvesVan Dam if he has a 2nd problem but feels strongly that he should follow up with his Neurologist because they can do more involved neuro exams.   Let him know to call if he has further questions. Angeline SlimAshley Suvi Archuletta RN

## 2018-01-29 ENCOUNTER — Telehealth: Payer: Self-pay | Admitting: Neurology

## 2018-01-29 NOTE — Telephone Encounter (Signed)
error 

## 2018-01-29 NOTE — Telephone Encounter (Signed)
Rn call patients mom Nettie ElmSylvia on dpr. Rn asking about the MRI and appt with Dr.SEthi. Rn stated pt was given MRI in May 2019, and was call for results on 12/06/2017 to pt and mom. RN stated the phone call to us stated they wanted the our MD to review it, and read the results. The mom stated they got the results of the MRi in June 2019, that his mass is getting better. Infectious disease stated he can follow up at our office if more testing is needed. The mom stated her son just wants to know the long term goals if he will need scans yearly or every 6 months. Pt was on steroids,and antibiotics by infectious disease. RN stated pt can see Dr.Sethi towards the end of August 2019. The mom will have pt to call for scheduling an appt.

## 2018-01-29 NOTE — Telephone Encounter (Addendum)
Pt's mother Cory Hernandez is wanting to schedule an appt for follow up of MRI that was ordered by his PCP. She said PCP is wanting him to see a neurologist to read the results. She is not wanting to schedule an appointment with Shanda BumpsJessica, this is a patient of Dr Roda ShuttersXu. Please call to advise who this patient should see. She is

## 2018-01-29 NOTE — Telephone Encounter (Signed)
When pt calls back schedule him with Dr. Pearlean BrownieSEthi towards the end of August 2019. Thanks.

## 2018-03-06 NOTE — Telephone Encounter (Signed)
Scheduled patient with Dr. Pearlean Brownie on 04-09-18 at 9:30am check in at 9am.

## 2018-04-09 ENCOUNTER — Encounter: Payer: Self-pay | Admitting: Neurology

## 2018-04-09 ENCOUNTER — Ambulatory Visit (INDEPENDENT_AMBULATORY_CARE_PROVIDER_SITE_OTHER): Payer: 59 | Admitting: Neurology

## 2018-04-09 VITALS — BP 120/79 | HR 76 | Ht 70.0 in | Wt 181.0 lb

## 2018-04-09 DIAGNOSIS — B699 Cysticercosis, unspecified: Secondary | ICD-10-CM | POA: Diagnosis not present

## 2018-04-09 DIAGNOSIS — B690 Cysticercosis of central nervous system: Secondary | ICD-10-CM

## 2018-04-09 NOTE — Patient Instructions (Signed)
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  He is doing quite well.  I recommend we repeat MRI scan of the brain with and without contrast next month as well as an EEG.  The patient is at high risk for seizures and epilepsy from this neurocysticercosis lesion but will hold off on anticonvulsants unless he has an obvious seizure.  He will return for follow-up in 6 months or call earlier if necessary  Cysticercosis Cysticercosis is an infection that is caused by the young form (larvae) of a tapeworm (Taenia solium) that can be found in pork. When eggs from this tapeworm are consumed, the eggs develop into larvae inside of the body. Larvae can spread through the body to areas such as the eyes, skin, muscle tissue, heart, and brain. When cysticercosis occurs in the brain, the condition is called neurocysticercosis. Severe infections in the brain can be life-threatening. What are the causes? This condition is caused by swallowing eggs that come from the stool a person who is infected with the adult form of the Taenia solium tapeworm. This can happen when you consume contaminated food-often fruits and vegetables-or contaminated water. This can also happen when you put contaminated fingers in your mouth. People who are infected with the adult form of the tapeworm have a condition that is called taeniasis. Nydia Bouton is an intestinal infection that is typically caused by eating undercooked pork that contains Taenia solium larvae inside of sacs (cysts) within the pig's muscles or other tissues. After the larvae enter the human body, they come out of cysts in the intestines and develop into adult tapeworms. If you have taeniasis, you may have few symptoms or no symptoms, but you will shed Taenia solium eggs in your stool. This means  that you are a carrier of the tapeworm, and these eggs may infect others and cause them to develop cysticercosis. What increases the risk? The following factors may make you more likely to develop this condition:  Living in or traveling to places where pigs roam freely, such as rural, developing countries.  Living in a household with someone who has a pork tapeworm infection.  Eating food that was prepared by someone who has a pork tapeworm infection.  Eating unwashed fruits and vegetables that were handled by someone who is a carrier of a tapeworm.  What are the signs or symptoms? Symptoms vary depending on the location and number of cysts in your body. Symptoms typically begin months to years after infection, and they commonly occur when the cysts are in the process of dying.  Cysts in the muscles usually do not cause symptoms. In some cases, you may be able to feel lumps under your skin.  Cysts in the brain are most common and cause abnormalities in the brain and spinal cord (central nervous system). Symptoms may include: ? Seizures. ? Headaches. ? Confusion. ? Lack of attention to people and surroundings. ? Difficulty with balance. ? Buildup of fluid in the brain (hydrocephalus).  Infection of the eye is rare and may cause: ? Blurry or unusual vision. ? Swelling or detachment of the retina. Retinal detachment occurs when the thin membrane that covers the back of the eye (retina) separates (detaches) from the eyeball. The retina is the part of your eye that sends visual signals to the brain along the optic nerve. This  allows you to see. ? Eye pain that happens repeatedly (recurs).  How is this diagnosed? This condition is diagnosed with tests, which may include:  MRI.  CT scans of the brain.  Blood tests.  Stool tests. You may need to submit stool samples to be tested for tapeworm eggs.  Your health care provider will ask about your eating habits, the places you have  lived, and your travel history. Other members of your household may also need to be tested for cysticercosis. How is this treated? Treatment depends on the number of cysts and the symptoms you have. In some cases, no treatment is needed. When only one cyst is found, treatment is often not given. If you have more than one cyst, treatment may include:  Medicines that kill tapeworms (antiparasitics).  Medicines that reduce inflammation.  Medicines that control seizures (anticonvulsants).  Surgery. This may be needed if: ? You have cysts in your eyes. ? You have hydrocephalus. ? Medicines are ineffective.  Follow these instructions at home:   Take over-the-counter and prescription medicines only as told by your health care provider.  Wash your hands frequently with soap and water. If soap and water are not available, use hand sanitizer.  Keep all follow-up visits as told by your health care provider. This is important. How is this prevented?  Do not eat raw or undercooked pork. Always cook pork thoroughly.  Wash your hands with soap and water: ? After you use the toilet. ? Before you handle or prepare food. ? After you handle or prepare food.  Before you eat raw fruits and vegetables: ? Wash them in boiled, bottled, or treated water. ? Peel them.  When traveling in developing countries: ? Make sure to drink water that is bottled or treated. ? Do not eat or drink anything that may be contaminated, including beverages with ice cubes that may have been made from unboiled or untreated water. ? Do not eat raw foods that have been washed in unboiled tap water. ? It is safe to drink bottled or canned beverages, such as carbonated beverages, teas, pasteurized fruit drinks, or steaming hot beverages. Get help right away if:  You develop seizures.  You feel light-headed or you faint.  You become confused.  You have vision problems. This information is not intended to replace  advice given to you by your health care provider. Make sure you discuss any questions you have with your health care provider. Document Released: 05/24/2004 Document Revised: 11/26/2015 Document Reviewed: 01/02/2015 Elsevier Interactive Patient Education  [employer].

## 2018-04-09 NOTE — Progress Notes (Signed)
NEUROLOGY CLINIC FOLLOW UP VISIT NOTE  NAME: Cory Hernandez DOB: [birth year] REFERRING PHYSICIAN: No ref. provider found   .  HPI: Dr Roda Shutters 05/31/2017 Cory Hernandez is a 42 y.o. male with no PMH who presents as a new patient for left facial droop.   Patient stated that for the last 3 weeks, he found himself slowing down his speech, intermittently stumbling on his words, feeling like talking with swollen tongue, also has drooling on the left side corner of the mouth.  3-4 days ago, he woke up with right facial twitching, intermittent, lasting all day long, eventually went away but her speech become more altered.  He looked at the mirror and found he had left facial droop.  He denies any headache, numbness, weakness, loss of consciousness, or fever.  Also no recent illness.  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  He was referred here for further evaluation.  He denied any significant past medical history.  Denies smoking or heavy drinking.  Denies any illicit drug use.  However he admitted that he used Viper, but regular content, no illegal stuff. Update 04/09/2018 : The patient is referred back to neurology clinic today for further neurological follow-up by his other physicians caring for him.  He saw Dr. Raynald Kemp once in November 2018 for left facial droop and was found to have a cystic right frontal mass with surrounding edema.  Dr. Raynald Kemp referred the patient to Dr. Wynetta Emery from neurosurgery who in turn referred the patient to infectious disease who thought patient may have neurocysticercosis since he had traveled to [place] a few years prior.  Patient was also seen at [facility] for second opinion who did an MRI scan which showed stable appearance of the lesion and they also recommended an empirical trial of treatment for neurocysticercosis rather than a brain biopsy or excision.  Patient was treated with empirical course of praziquantel and actually  responded to treatment well and follow-up MRI scan in May 2019 showed significant resolution of the edema and decrease in size of the cystic lesion.  Patient has done well and has not had any further facial weakness or other focal neurological symptoms.  He has had no definite documented seizures.  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  He has no neurological complaints today.  Past Medical History:  Diagnosis Date  . Anxiety 08/29/2017  . Diarrhea 07/24/2017  . Epididymitis 08/29/2017  . H/O balanitis 08/29/2017  . 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 Pericarditis   . Pericarditis 08/29/2017  . Routine screening for STI (sexually transmitted infection) 08/29/2017   History reviewed. No pertinent surgical history. Family History  Problem Relation Age of Onset  . Stroke Maternal Grandmother   . Stroke Paternal Grandfather    No current outpatient medications on file.   No current facility-administered medications for this visit.    No Known Allergies Social History   Socioeconomic History  . Marital status: Single    Spouse name: Not on file  . Number of children: Not on file  . Years of education: Not on file  . Highest education level: Not on file  Occupational History  . Not on file  Social Needs  . Financial resource strain: Not on file  . Food insecurity:    Worry: Not on file    Inability: Not on file  . Transportation needs:    Medical: Not on  file    Non-medical: Not on file  Tobacco Use  . Smoking status: Current Every Day Smoker    Packs/day: 0.25    Types: E-cigarettes  . Smokeless tobacco: Never Used  . Tobacco comment: 1 to 2 per day  Substance and Sexual Activity  . Alcohol use: Yes    Alcohol/week: 1.0 standard drinks    Types: 1 Cans of beer per week    Comment: socially  . Drug use: No  . Sexual activity: Not on file  Lifestyle  . Physical activity:    Days per week: Not on file    Minutes per session: Not on file  .  Stress: Not on file  Relationships  . Social connections:    Talks on phone: Not on file    Gets together: Not on file    Attends religious service: Not on file    Active member of club or organization: Not on file    Attends meetings of clubs or organizations: Not on file    Relationship status: Not on file  . Intimate partner violence:    Fear of current or ex partner: Not on file    Emotionally abused: Not on file    Physically abused: Not on file    Forced sexual activity: Not on file  Other Topics Concern  . Not on file  Social History Narrative  . Not on file    Review of Systems Full 14 system review of systems performed and notable only for those listed, all others are neg:  No complaints today  Physical Exam  Vitals:   04/09/18 0954  BP: 120/79  Pulse: 76    General - Well nourished, well developed Hispanic male, in no apparent distress.  Ophthalmologic - Sharp disc margins OU. Good p.o. topical Cardiovascular - Regular rate and rhythm with no murmur.   Neck - supple, no nuchal rigidity.  Mental Status -  Level of arousal and orientation to time, place, and person were intact. Language including expression, naming, repetition, comprehension, reading, and writing was assessed and found intact. Attention span and concentration were normal. Recent and remote memory were intact. Fund of Knowledge was assessed and was intact.  Cranial Nerves II - XII - II - Visual field intact OU. III, IV, VI - Extraocular movements intact. V - Facial sensation intact bilaterally. VII - left nasolabial fold flattening, and mild left facial droop. VIII - Hearing & vestibular intact bilaterally. X - Palate elevates symmetrically,  XI - Chin turning & shoulder shrug intact bilaterally. XII - Tongue protrusion intact.  Motor Strength - The patient's strength was normal in all extremities and pronator drift was absent.  Bulk was normal and fasciculations were absent.   Motor  Tone - Muscle tone was assessed at the neck and appendages and was normal.  Reflexes - The patient's reflexes were normal in all extremities and he had no pathological reflexes.  Sensory - Light touch, temperature/pinprick, vibration and proprioception, and Romberg testing were assessed and were normal.    Coordination - The patient had normal movements in the hands and feet with no ataxia or dysmetria.  Tremor was absent.  Gait and Station - The patient's transfers, posture, gait, station, and turns were observed as normal.   Imaging  I have personally reviewed the radiological images below and agree with the radiology interpretations.  CT head 05/30/17  Right parietal hemisphere lesion for which diagnostic considerations include primary or metastatic tumor or possibly brain abscess.  Recommend neurosurgical evaluation.  Lab Review WBC 8.5, hemoglobin 14.6, platelet 270, creatinine 1.2, BUN 15, sodium 135  Assessment and Plan:   In summary, Cory Hernandez is a 42 y.o. male with no significant PMH presented with subacute onset left facial droop, dysarthria, and a one-time right facial twitching in 2018.  CT head showed right parietal lesion with border enhancement concerning for brain tumor vs abscess.  MRI scan of the brain showed right frontoparietal subcortical cystic mass lesion with slight peripheral rim enhancement and surrounding vasogenic edema.  Patient had traveled to [place] a few years previously and had not taken any precautions while consuming local food and water.  He was seen by neurosurgery and empirical treatment for neurocysticercosis was recommended after discussion with ID as well as second opinion and [facility].  Patient responded to treatment with praziquantel  And follow-up MRI scan on 11/29/2017 showing significant improvement in the lesion and resolution of the vasogenic edema.  He has not had any definite known seizures.  Orders Placed This Encounter  Procedures  . MR  BRAIN W WO CONTRAST    Standing Status:   Future    Standing Expiration Date:   06/10/2019    Order Specific Question:   If indicated for the ordered procedure, I authorize the administration of contrast media per Radiology protocol    Answer:   Yes    Order Specific Question:   What is the patient's sedation requirement?    Answer:   No Sedation    Order Specific Question:   Does the patient have a pacemaker or implanted devices?    Answer:   No    Order Specific Question:   Radiology Contrast Protocol - do NOT remove file path    Answer:   [email or web address]    Order Specific Question:   Preferred imaging location?    Answer:   External    Comments:   gna read  . EEG adult    Standing Status:   Future    Standing Expiration Date:   04/10/2019    No orders of the defined types were placed in this encounter.  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  He is doing quite well.  I recommend we repeat MRI scan of the brain with and without contrast next month as well as an EEG.  The patient is at high risk for seizures and epilepsy from this neurocysticercosis lesion but will hold off on anticonvulsants unless he has an obvious seizure.  Greater than 50% time during this prolonged 45-minute visit was spent on counseling and coordination of care about neurocysticercosis, seizure risk , review of medical records from [facility] as well as here and personal review of imaging studies and answering questions he will return for follow-up in 6 months or call earlier if necessary      Delia Heady, MD [facility] [address], [address] [place], [place] [address] [phone]

## 2018-04-10 ENCOUNTER — Telehealth: Payer: Self-pay | Admitting: Neurology

## 2018-04-10 NOTE — Telephone Encounter (Signed)
[employer] pending faxed clinical notes  °

## 2018-04-11 NOTE — Telephone Encounter (Signed)
I called [employer] to check the status it is in medical review and they did receive the fax of the clinical notes.

## 2018-04-12 NOTE — Telephone Encounter (Signed)
It is still pending and I also just saw he had a second [facility] insurance and I started the case on that one as well and it is pending.

## 2018-04-13 NOTE — Telephone Encounter (Signed)
1st [employer] Auth: [id number] (exp. 04/12/18 to 05/27/18) 2nd [employer] AutH: [id number] (exp. 04/12/18 to 05/27/18 order sent to GI lvm for pt to be aware of this. I also informed him in the message if he has not heard from GI in the next 2-3 business days to give them a call at [phone]

## 2018-04-18 ENCOUNTER — Ambulatory Visit (INDEPENDENT_AMBULATORY_CARE_PROVIDER_SITE_OTHER): Payer: 59

## 2018-04-18 DIAGNOSIS — B690 Cysticercosis of central nervous system: Secondary | ICD-10-CM

## 2018-04-18 DIAGNOSIS — B699 Cysticercosis, unspecified: Secondary | ICD-10-CM

## 2018-04-18 DIAGNOSIS — R2990 Unspecified symptoms and signs involving the nervous system: Secondary | ICD-10-CM | POA: Diagnosis not present

## 2018-05-14 ENCOUNTER — Telehealth: Payer: Self-pay

## 2018-05-14 NOTE — Telephone Encounter (Signed)
-----   Message from Pramod S Sethi, MD sent at 05/11/2018 12:09 PM EST ----- Kindly inform the patient that EEG study was normal 

## 2018-05-14 NOTE — Telephone Encounter (Signed)
RN receive call from pts mom on dpr. Rn explain EEG was normal, no seizure activity noted during the test.:PT verbalized understanding.

## 2018-05-14 NOTE — Telephone Encounter (Signed)
Notes recorded by Hildred Alamin, RN on 05/14/2018 at 8:47 AM EST Rn call patient unable to leave message. ------

## 2018-05-15 ENCOUNTER — Telehealth: Payer: Self-pay

## 2018-05-15 NOTE — Telephone Encounter (Signed)
Rn tried to call patient to schedule MRI. Pts vm is still full. Rn unable to leave message.

## 2018-05-15 NOTE — Telephone Encounter (Signed)
Rn call patients mom on dpr. Rn explain pt was call but his vm is still full. RN explain to mom per Irving Burtonmily pt was left a vm in 04/2018 about scheduling but he never responded. PT never return [place] call or schedule his MRI. RN explain to mom the MRI is only authorized from 04/12/2018 to 05/27/2018. Rn explain to mom after 11//24/2019 the order expires. The mom was given the number of [phone] for GI to have her son call The mom verbalized understanding, and will tell her son.

## 2018-05-18 ENCOUNTER — Other Ambulatory Visit: Payer: Self-pay

## 2018-05-27 ENCOUNTER — Other Ambulatory Visit: Payer: Self-pay

## 2018-08-20 ENCOUNTER — Encounter: Payer: Self-pay | Admitting: Neurology

## 2018-10-10 ENCOUNTER — Ambulatory Visit: Payer: 59 | Admitting: Neurology

## 2019-11-17 IMAGING — MR MR HEAD WO/W CM
11 series · 48 of 48 positions shown · IV contrast (multihance)
Comparison: 06/28/2017

CLINICAL DATA: Status post empiric treatment for
neurocysticercosis.

EXAM:
MRI HEAD WITHOUT AND WITH CONTRAST
TECHNIQUE: Multiplanar, multiecho pulse sequences of the brain and surrounding
structures were obtained without and with intravenous contrast.
CONTRAST:  15mL MULTIHANCE GADOBENATE DIMEGLUMINE 529 MG/ML IV SOLN

[Series 3: T1 · sagittal · 5.0mm · 0.45mm/px · 1 of 21 slices shown]
[im 1/21]
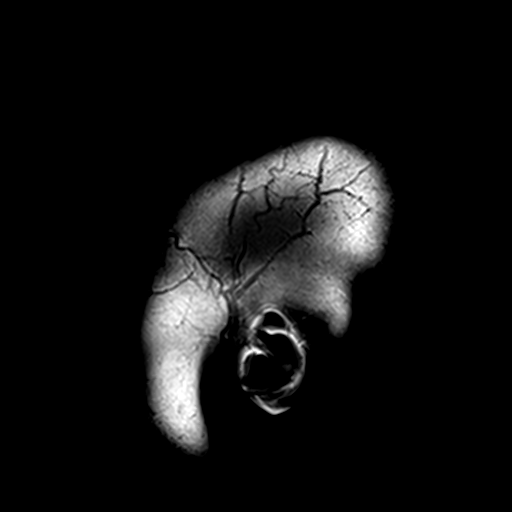

[Series 4: DWI · axial · 3.0mm · 1.80mm/px · z∈[-69,+78]mm · 7 of 100 slices shown (1 of 2)]
[im 1/100]
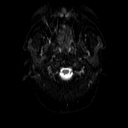
[im 17/100]
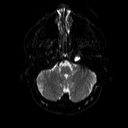
[im 34/100]
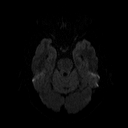
[im 50/100]
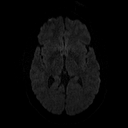
[im 67/100]
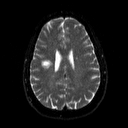
[im 83/100]
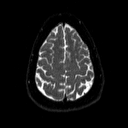
[im 100/100]
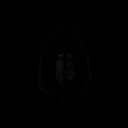

[Series 5: DWI · axial · 3.0mm · 1.80mm/px · z∈[-69,+78]mm · 4 of 48 slices shown (2 of 2)]
[im 1/48]
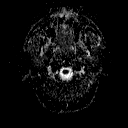
[im 16/48]
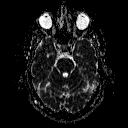
[im 32/48]
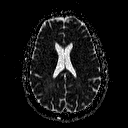
[im 48/48]
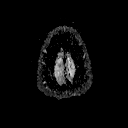

[Series 6: T2 · axial · 5.0mm · 0.60mm/px · z∈[-67,+79]mm · 2 of 22 slices shown (1 of 2)]
[im 1/22]
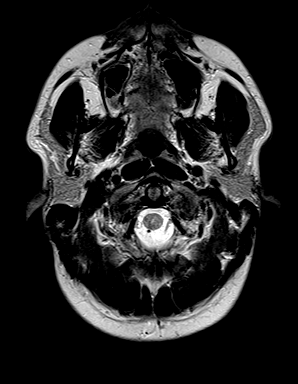
[im 22/22]
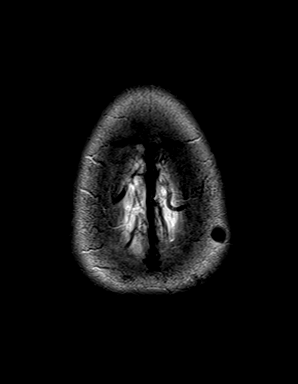

[Series 7: FLAIR · axial · 3.0mm · 0.45mm/px · z∈[-71,+80]mm · 2 of 30 slices shown]
[im 1/30]
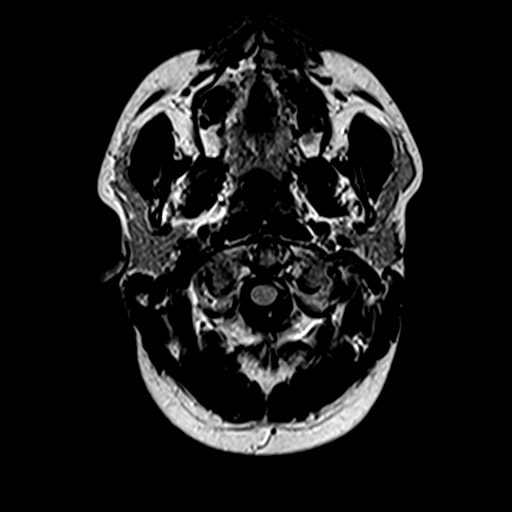
[im 30/30]
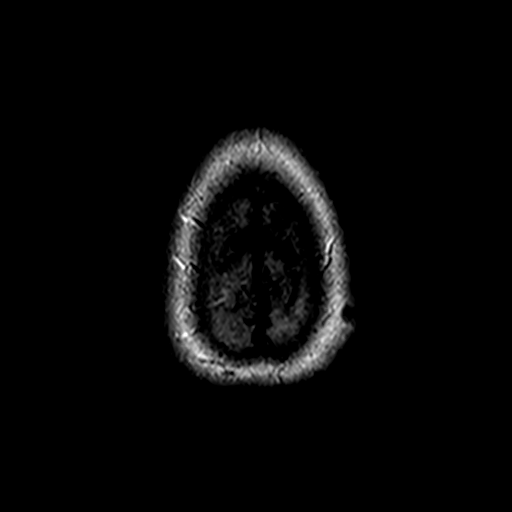

[Series 9: swi_images · axial · 2.0mm · 0.90mm/px · z∈[-74,+84]mm · 6 of 80 slices shown]
[im 1/80]
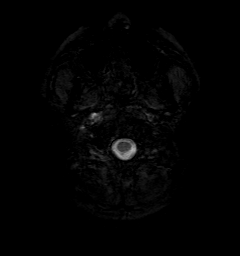
[im 16/80]
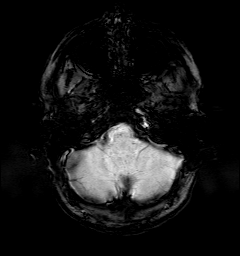
[im 32/80]
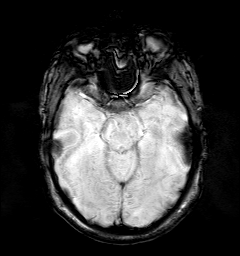
[im 48/80]
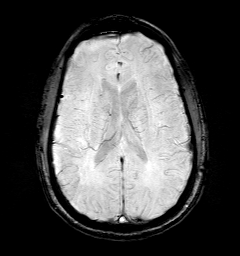
[im 64/80]
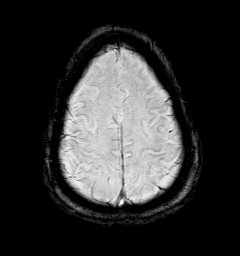
[im 80/80]
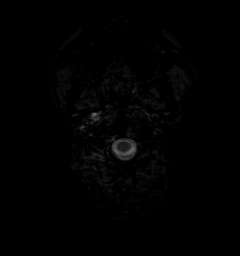

[Series 10: t1_mpr_tra · axial · 1.2mm · 0.72mm/px · z∈[-73,+85]mm · 10 of 128 slices shown (1 of 2)]
[im 1/128]
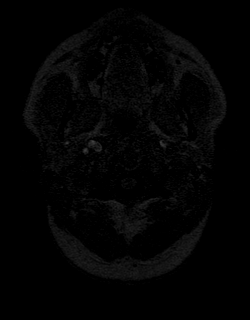
[im 15/128]
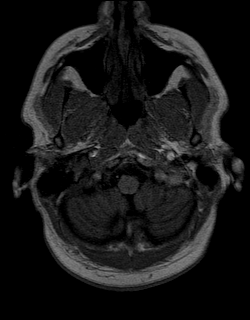
[im 29/128]
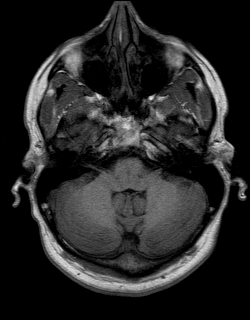
[im 43/128]
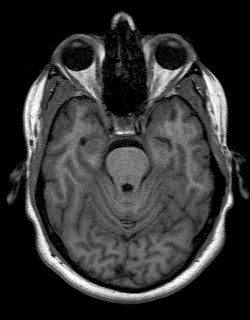
[im 57/128]
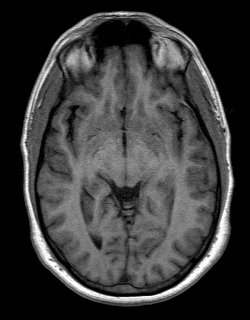
[im 71/128]
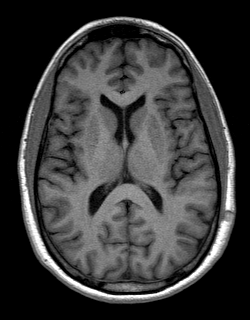
[im 85/128]
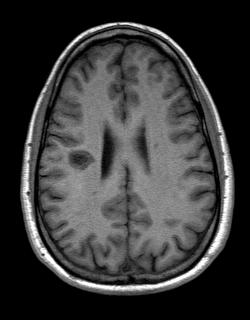
[im 99/128]
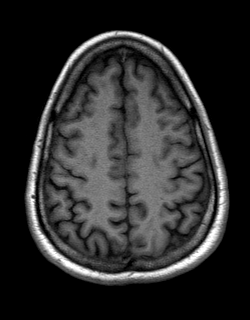
[im 113/128]
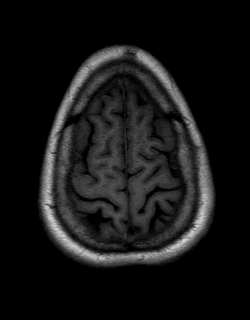
[im 128/128]
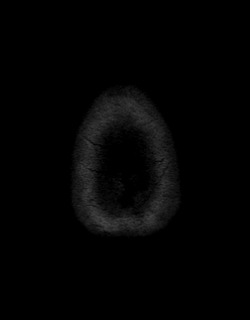

[Series 11: T2 · coronal · 5.0mm · 0.60mm/px · 2 of 27 slices shown (2 of 2)]
[im 1/27]
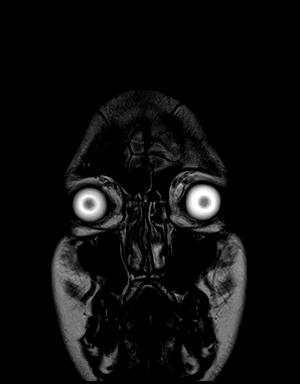
[im 27/27]
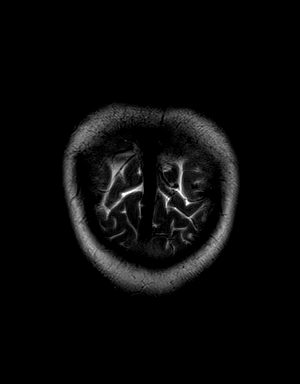

[Series 12: t1_mpr_tra · axial · 1.2mm · 0.72mm/px · z∈[-73,+85]mm · 10 of 128 slices shown (2 of 2)]
[im 1/128]
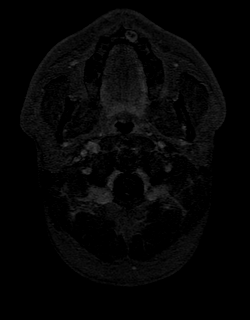
[im 15/128]
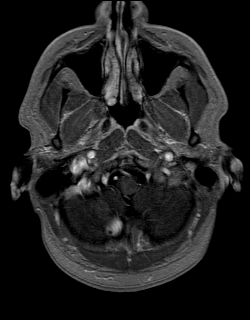
[im 29/128]
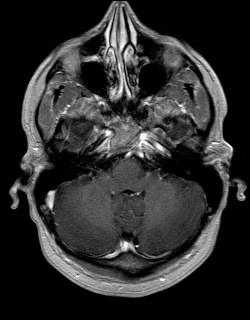
[im 43/128]
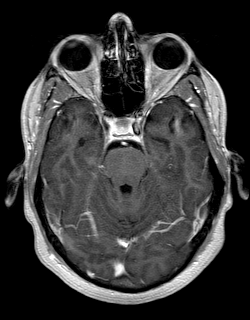
[im 57/128]
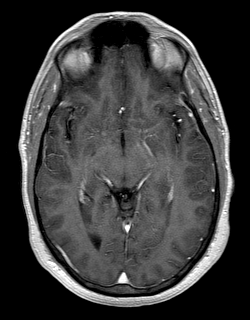
[im 71/128]
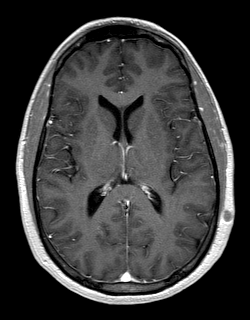
[im 85/128]
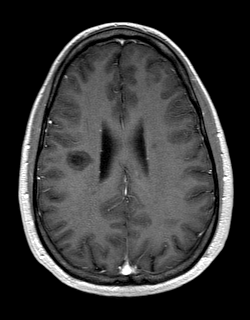
[im 99/128]
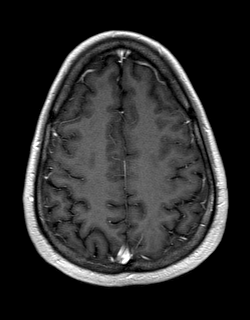
[im 113/128]
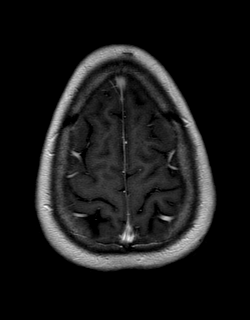
[im 128/128]
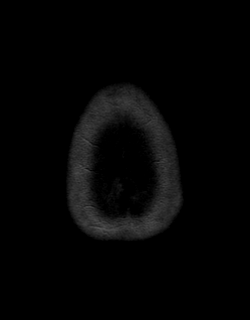

[Series 13: post cor · coronal · 5.0mm · 0.45mm/px · 2 of 27 slices shown]
[im 1/27]
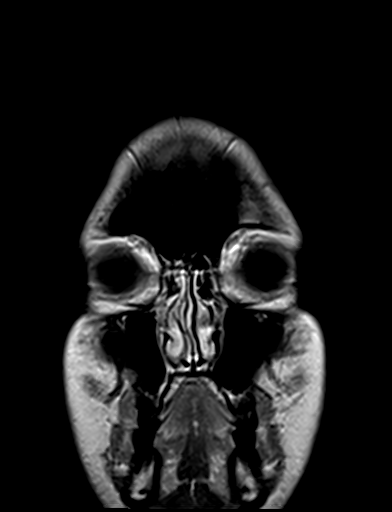
[im 27/27]
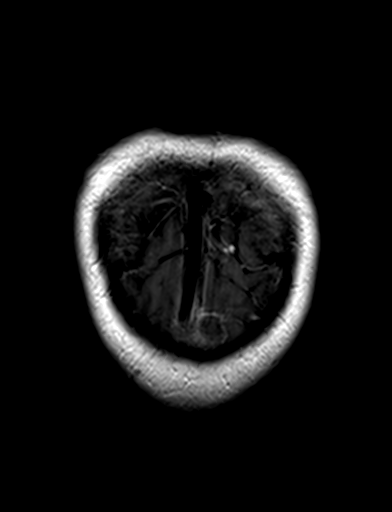

[Series 14: post sag (optional · sagittal · 5.0mm · 0.45mm/px · 2 of 21 slices shown]
[im 1/21]
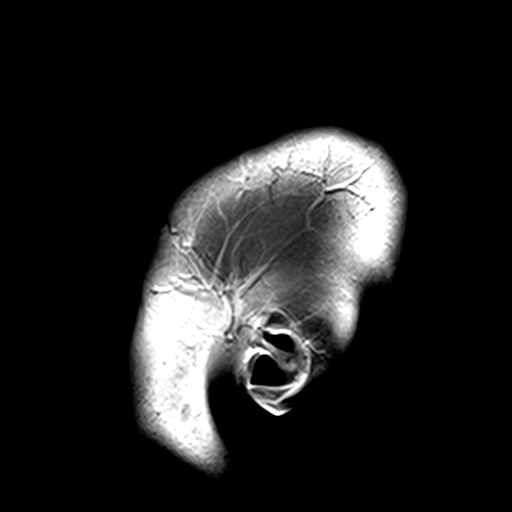
[im 21/21]
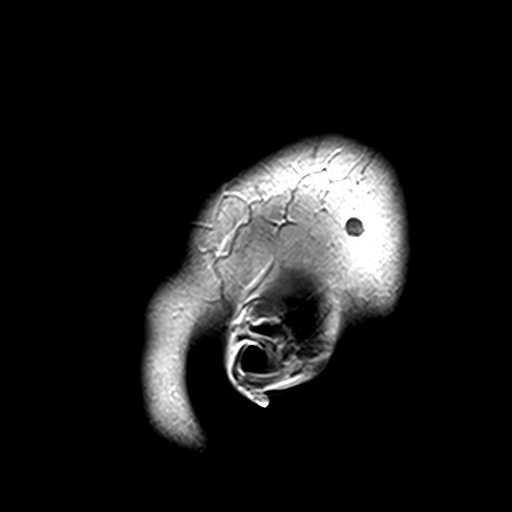

[48 of 48 positions shown; findings below may reference images not displayed]

FINDINGS: Brain: The ovoid T2 hyperintense mass in the right frontal white
matter has collapsed and measures up to 16 mm today as compared to
26 mm previously. There is a periphery of T2 hyperintensity which
may be gliosis or mild residual edema. There is no residual mass
effect. No convincing residual enhancement.. No ventriculitis,
infarct, hydrocephalus, or collection.

Vascular: Major flow voids and vascular enhancements are preserved.
Incidental aplastic left transverse and sigmoid dural venous
sinuses.

Skull and upper cervical spine: No evidence of marrow lesion

Sinuses/Orbits: Negative
IMPRESSION: The right frontal mass has significantly decreased in size and
enhancement after interval empiric neurocysticercosis treatment.
Residual peripheral T2 hyperintensity may reflect gliosis or minimal
edema. No new abnormality.
